# Patient Record
Sex: Male | Born: 1980 | Race: Black or African American | Hispanic: No | Marital: Married | Smoking: Never smoker
Health system: Southern US, Community
[De-identification: ages and names within clinical notes are randomized; demographics above are authoritative.]

---

## 2010-10-05 ENCOUNTER — Encounter (INDEPENDENT_AMBULATORY_CARE_PROVIDER_SITE_OTHER): Payer: Self-pay | Admitting: *Deleted

## 2010-10-05 ENCOUNTER — Ambulatory Visit: Payer: Self-pay | Admitting: Sports Medicine

## 2010-10-05 DIAGNOSIS — D492 Neoplasm of unspecified behavior of bone, soft tissue, and skin: Secondary | ICD-10-CM

## 2010-11-29 NOTE — Assessment & Plan Note (Signed)
Summary: NP,L HAMSTRING W/S PER JACOBS,MC   Vital Signs:  Patient profile:   30 year old male Height:      68 inches Weight:      170 pounds BMI:     25.94 Pulse rate:   68 / minute BP sitting:   123 / 83  (left arm)  Vitals Entered By: Rochele Pages RN (October 05, 2010 1:43 PM) CC: tightness behind L knee, and hard know   CC:  tightness behind L knee and and hard know.  History of Present Illness: Pt presents to clinic for evaluation of tightness behind left knee and knot that he has noticed for the last 2-3 weeks.  States he noticed the tightness after he had been sitting for a while at home, felt behind knee and noticed knot.  Does not cause pain.   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

## 2010-11-29 NOTE — Letter (Signed)
Summary: *Consult Note  [facility]  [address]   [place], [place] [address]   Phone: [phone]  Fax: [phone]    Re:    Zachary Jenkins DOB:    05-10-1981 [name], DO [facility] [address] [place], [place] [address] Fax: [phone]  10/05/2010   Dear:    Danae Chen you for requesting that we see the above patient for consultation.  A copy of the detailed office note will be sent under separate cover, for your review.  Evaluation today is consistent with:  1)  NEOPLASMS UNSPEC NATURE BONE SOFT TISSUE&SKIN (ICD-239.2)   Our recommendation is for: this is not cystic and has a different echoic pattern than other muscle along with neovessels.  This may require biopsy or other work up.  Discuss with Eulah Pont and Thurston Hole and get their opinion of how to proceed.   New Orders include:  1)  New Patient Level II [99202] 2)  [place] LIMITED [[id number]]   Thank you for this consultation.  If you have any further questions regarding the care of this patient, please do not hesitate to contact me @ [phone].  Thank you for this opportunity to look after your patient.  Sincerely,  Vincent Gros MD

## 2010-11-29 NOTE — Letter (Signed)
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  He may return to work as of 10/06/10 with no restrictions.           Sincerely,   Dr. Enid Baas

## 2010-12-01 NOTE — Letter (Signed)
Summary: ROI  ROI   Imported By: Marily Memos 10/10/2010 10:27:55  _____________________________________________________________________  External Attachment:    Type:   Image     Comment:   External Document

## 2011-04-19 ENCOUNTER — Emergency Department (HOSPITAL_COMMUNITY)
Admission: EM | Admit: 2011-04-19 | Discharge: 2011-04-19 | Disposition: A | Discharge status: Home / Self Care | Payer: No Typology Code available for payment source | Attending: Emergency Medicine | Admitting: Emergency Medicine

## 2011-04-19 ENCOUNTER — Emergency Department (HOSPITAL_COMMUNITY): Payer: No Typology Code available for payment source

## 2011-04-19 ENCOUNTER — Encounter (HOSPITAL_COMMUNITY): Payer: Self-pay

## 2011-04-19 DIAGNOSIS — R109 Unspecified abdominal pain: Secondary | ICD-10-CM | POA: Insufficient documentation

## 2011-04-19 DIAGNOSIS — R51 Headache: Secondary | ICD-10-CM | POA: Insufficient documentation

## 2011-04-19 DIAGNOSIS — IMO0002 Reserved for concepts with insufficient information to code with codable children: Secondary | ICD-10-CM | POA: Insufficient documentation

## 2011-04-19 DIAGNOSIS — S8990XA Unspecified injury of unspecified lower leg, initial encounter: Secondary | ICD-10-CM | POA: Insufficient documentation

## 2011-04-19 DIAGNOSIS — M25569 Pain in unspecified knee: Secondary | ICD-10-CM | POA: Insufficient documentation

## 2011-04-19 DIAGNOSIS — R040 Epistaxis: Secondary | ICD-10-CM | POA: Insufficient documentation

## 2011-04-19 DIAGNOSIS — R079 Chest pain, unspecified: Secondary | ICD-10-CM | POA: Insufficient documentation
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
## 2016-05-05 ENCOUNTER — Ambulatory Visit (INDEPENDENT_AMBULATORY_CARE_PROVIDER_SITE_OTHER): Payer: Self-pay | Admitting: Urgent Care

## 2016-05-05 VITALS — BP 120/72 | HR 59 | Temp 98.2°F | Resp 18 | Ht 68.0 in | Wt 175.0 lb

## 2016-05-05 DIAGNOSIS — Z024 Encounter for examination for driving license: Secondary | ICD-10-CM

## 2016-05-05 DIAGNOSIS — Z021 Encounter for pre-employment examination: Secondary | ICD-10-CM

## 2016-05-05 DIAGNOSIS — M549 Dorsalgia, unspecified: Secondary | ICD-10-CM

## 2016-05-05 NOTE — Progress Notes (Signed)
[employer] Medical Examination   Zachary Jenkins is a 35 y.o. male who presents today for a DOT physical exam. The patient reports that he is undergoing worker's comp case. He has back pain but no pain medication.  The following portions of the patient's history were reviewed and updated as appropriate: allergies, current medications, past family history, past medical history, past social history and past surgical history.  Objective:   BP 120/72 mmHg  Pulse 59  Temp(Src) 98.2 F (36.8 C) (Oral)  Resp 18  Ht 5\' 8"  (1.727 m)  Wt 175 lb (79.379 kg)  BMI 26.61 kg/m2  SpO2 98%  Vision/hearing:  Visual Acuity Screening   Right eye Left eye Both eyes  Without correction: 20/10-1 20/13-1 20/10  With correction:     Comments: Peripheral Vision: Right eye 85 degrees. Left eye 85 degrees.  Color: 6/6  Hearing Screening Comments: The patient was able to hear a forced whisper from 10 feet.  Applicant can recognize and distinguish among traffic control signals and devices showing standard red, green, and amber colors.  Corrective lenses required: No  Monocular Vision?: No  Hearing aid requirement: No  Physical Exam  Constitutional: He is oriented to person, place, and time. He appears well-developed and well-nourished.  HENT:  TM's intact bilaterally, no effusions or erythema. Nasal turbinates pink and moist, nasal passages patent. No sinus tenderness. Oropharynx clear, mucous membranes moist, dentition in good repair.  Eyes: Conjunctivae and EOM are normal. Pupils are equal, round, and reactive to light. Right eye exhibits no discharge. Left eye exhibits no discharge. No scleral icterus.  Neck: Normal range of motion. Neck supple. No thyromegaly present.  Cardiovascular: Normal rate, regular rhythm and intact distal pulses.  Exam reveals no gallop and no friction rub.   No murmur heard. Pulmonary/Chest: No stridor. No respiratory distress. He has no wheezes. He has no  rales.  Abdominal: Soft. Bowel sounds are normal. He exhibits no distension and no mass. There is no tenderness.  Musculoskeletal: Normal range of motion. He exhibits no edema or tenderness.  Lymphadenopathy:    He has no cervical adenopathy.  Neurological: He is alert and oriented to person, place, and time. He has normal reflexes.  Skin: Skin is warm and dry. No rash noted. No erythema. No pallor.  Psychiatric: He has a normal mood and affect.   Labs: Comments: sp 1.025 Pro: neg Glu: neg Blo: neg   Assessment:    Healthy male exam.  Meets standards in 85 CFR 391.41;  qualifies for 2 year certificate.    Plan:   Medical examiners certificate completed and printed. Return as needed.

## 2016-05-05 NOTE — Patient Instructions (Addendum)
Keeping you healthy  Get these tests  Blood pressure- Have your blood pressure checked once a year by your healthcare provider.  Normal blood pressure is 120/80.  Weight- Have your body mass index (BMI) calculated to screen for obesity.  BMI is a measure of body fat based on height and weight. You can also calculate your own BMI at [employer]velBags.it.  Cholesterol- Have your cholesterol checked regularly starting at age 34, sooner may be necessary if you have diabetes, high blood pressure, if a family member developed heart diseases at an early age or if you smoke.   Chlamydia, HIV, and other sexual transmitted disease- Get screened each year until the age of 67 then within three months of each new sexual partner.  Diabetes- Have your blood sugar checked regularly if you have high blood pressure, high cholesterol, a family history of diabetes or if you are overweight.  Get these vaccines  Flu shot- Every fall.  Tetanus shot- Every 10 years.  Menactra- Single dose; prevents meningitis.  Take these steps  Don't smoke- If you do smoke, ask your healthcare provider about quitting. For tips on how to quit, go to www.smokefree.gov or call 1-800-QUIT-NOW.  Be physically active- Exercise 5 days a week for at least 30 minutes.  If you are not already physically active start slow and gradually work up to 30 minutes of moderate physical activity.  Examples of moderate activity include walking briskly, mowing the yard, dancing, swimming bicycling, etc.  Eat a healthy diet- Eat a variety of healthy foods such as fruits, vegetables, low fat milk, low fat cheese, yogurt, lean meats, poultry, fish, beans, tofu, etc.  For more information on healthy eating, go to www.thenutritionsource.org  Drink alcohol in moderation- Limit alcohol intake two drinks or less a day.  Never drink and drive.  Dentist- Brush and floss teeth twice daily; visit your dentis twice a year.  Depression-Your emotional  health is as important as your physical health.  If you're feeling down, losing interest in things you normally enjoy please talk with your healthcare provider.  Gun Safety- If you keep a gun in your home, keep it unloaded and with the safety lock on.  Bullets should be stored separately.  Helmet use- Always wear a helmet when riding a motorcycle, bicycle, rollerblading or skateboarding.  Safe sex- If you may be exposed to a sexually transmitted infection, use a condom  Seat belts- Seat bels can save your life; always wear one.  Smoke/Carbon Monoxide detectors- These detectors need to be installed on the appropriate level of your home.  Replace batteries at least once a year.  Skin Cancer- When out in the sun, cover up and use sunscreen SPF 15 or higher.  Violence- If anyone is threatening or hurting you, please tell your healthcare provider.    IF you received an x-ray today, you will receive an invoice from [facility]. Please contact [facility] at [phone] with questions or concerns regarding your invoice.   IF you received labwork today, you will receive an invoice from [employer]. Please contact [facility] at [phone] with questions or concerns regarding your invoice.   Our billing staff will not be able to assist you with questions regarding bills from these companies.  You will be contacted with the lab results as soon as they are available. The fastest way to get your results is to activate your My Chart account. Instructions are located on the last page of this paperwork. If you have  not heard from [place] regarding the results in 2 weeks, please contact this office.

## 2018-04-12 ENCOUNTER — Ambulatory Visit: Payer: Self-pay | Admitting: Physician Assistant
